# Patient Record
Sex: Female | Born: 1975 | Race: White | Hispanic: No | Marital: Married | Smoking: Never smoker
Health system: Southern US, Community
[De-identification: ages and names within clinical notes are randomized; demographics above are authoritative.]

## PROBLEM LIST (undated history)

## (undated) DIAGNOSIS — F329 Major depressive disorder, single episode, unspecified: Secondary | ICD-10-CM

## (undated) DIAGNOSIS — T7840XA Allergy, unspecified, initial encounter: Secondary | ICD-10-CM

## (undated) DIAGNOSIS — N390 Urinary tract infection, site not specified: Secondary | ICD-10-CM

## (undated) DIAGNOSIS — F419 Anxiety disorder, unspecified: Secondary | ICD-10-CM

## (undated) DIAGNOSIS — O020 Blighted ovum and nonhydatidiform mole: Secondary | ICD-10-CM

## (undated) DIAGNOSIS — F32A Depression, unspecified: Secondary | ICD-10-CM

## (undated) DIAGNOSIS — N3090 Cystitis, unspecified without hematuria: Secondary | ICD-10-CM

## (undated) HISTORY — DX: Allergy, unspecified, initial encounter: T78.40XA

## (undated) HISTORY — DX: Depression, unspecified: F32.A

## (undated) HISTORY — DX: Anxiety disorder, unspecified: F41.9

## (undated) HISTORY — DX: Blighted ovum and nonhydatidiform mole: O02.0

## (undated) HISTORY — DX: Cystitis, unspecified without hematuria: N30.90

## (undated) HISTORY — DX: Major depressive disorder, single episode, unspecified: F32.9

## (undated) HISTORY — PX: DILATION AND CURETTAGE OF UTERUS: SHX78

## (undated) HISTORY — DX: Urinary tract infection, site not specified: N39.0

---

## 2010-07-05 HISTORY — PX: INCISE AND DRAIN ABCESS: PRO64

## 2010-12-23 ENCOUNTER — Inpatient Hospital Stay: Payer: Self-pay | Admitting: Obstetrics and Gynecology

## 2015-07-29 ENCOUNTER — Other Ambulatory Visit: Payer: Self-pay | Admitting: Obstetrics and Gynecology

## 2015-07-29 DIAGNOSIS — Z1231 Encounter for screening mammogram for malignant neoplasm of breast: Secondary | ICD-10-CM

## 2015-07-31 ENCOUNTER — Ambulatory Visit
Admission: RE | Admit: 2015-07-31 | Discharge: 2015-07-31 | Disposition: A | Discharge status: Home / Self Care | Payer: BLUE CROSS/BLUE SHIELD | Source: Ambulatory Visit | Attending: Obstetrics and Gynecology | Admitting: Obstetrics and Gynecology

## 2015-07-31 DIAGNOSIS — Z1231 Encounter for screening mammogram for malignant neoplasm of breast: Secondary | ICD-10-CM | POA: Insufficient documentation

## 2015-08-05 ENCOUNTER — Other Ambulatory Visit: Payer: Self-pay | Admitting: Obstetrics and Gynecology

## 2015-08-05 ENCOUNTER — Encounter: Payer: Self-pay | Admitting: *Deleted

## 2015-08-05 DIAGNOSIS — R928 Other abnormal and inconclusive findings on diagnostic imaging of breast: Secondary | ICD-10-CM

## 2015-08-05 DIAGNOSIS — N390 Urinary tract infection, site not specified: Secondary | ICD-10-CM | POA: Insufficient documentation

## 2015-08-06 ENCOUNTER — Ambulatory Visit (INDEPENDENT_AMBULATORY_CARE_PROVIDER_SITE_OTHER): Payer: BLUE CROSS/BLUE SHIELD | Admitting: Obstetrics and Gynecology

## 2015-08-06 ENCOUNTER — Encounter: Payer: Self-pay | Admitting: Obstetrics and Gynecology

## 2015-08-06 VITALS — BP 147/81 | HR 93 | Resp 16 | Ht 62.0 in | Wt 130.9 lb

## 2015-08-06 DIAGNOSIS — N3090 Cystitis, unspecified without hematuria: Secondary | ICD-10-CM

## 2015-08-06 LAB — URINALYSIS, COMPLETE
BILIRUBIN UA: NEGATIVE
Glucose, UA: NEGATIVE
Ketones, UA: NEGATIVE
Leukocytes, UA: NEGATIVE
NITRITE UA: NEGATIVE
PH UA: 5.5 (ref 5.0–7.5)
Protein, UA: NEGATIVE
RBC UA: NEGATIVE
Specific Gravity, UA: 1.025 (ref 1.005–1.030)
UUROB: 0.2 mg/dL (ref 0.2–1.0)

## 2015-08-06 LAB — BLADDER SCAN AMB NON-IMAGING

## 2015-08-06 LAB — MICROSCOPIC EXAMINATION: WBC UA: NONE SEEN /HPF (ref 0–?)

## 2015-08-06 NOTE — Progress Notes (Signed)
08/06/2015 2:24 PM   Alexandria Garrett Oct 05, 1975 [id number]  Referring provider: No referring provider defined for this encounter.  Chief Complaint  Patient presents with  . Cystitis  . Establish Care    HPI: Patient is a 40 year old female presenting today with complaints of irritative voiding symptoms. She reports symptoms have been occurring since last year when she experienced 2 urinary tract infections. 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 She reports that she continues to experience some irritative voiding symptoms such as frequent urination, nocturia and a suprapubic burning sensation.  She did some research on her own and states that she suspects that she has interstitial cystitis. She has altered her diet and has been avoiding bladder irritants. She has noticed significant improvement reports almost 90% resolution of her symptoms since changing her diet. On occasion she will take Motrin at night when she has flareups.  She denies symptoms of dysuria, flank pain, gross hematuria, urgency or incontinence.  She was never a smoker. No hisotry of chemical exposures.  Good fluid intake. Coconut water and aloe juice improves symptoms.   07/29/15 UA unremarkable/ Urine culture- no growth   PMH: Past Medical History  Diagnosis Date  . Cystitis   . UTI (lower urinary tract infection)   . Molar pregnancy     Surgical History: Past Surgical History  Procedure Laterality Date  . Incise and drain abcess Right 2012    breast abcess  . Dilation and curettage of uterus      Home Medications:    Medication List    Notice  As of 08/06/2015  2:24 PM   You have not been prescribed any medications.      Allergies:  Allergies  Allergen Reactions  . Penicillins Other (See Comments)  . Sulfa Antibiotics Rash    Family History: Family History  Problem Relation Age of Onset  . Breast cancer Neg Hx   . Alzheimer's  disease Maternal Grandmother   . Endometriosis Mother   . Hypertension Mother   . Heart failure Maternal Grandfather     Social History:  reports that she has never smoked. She does not have any smokeless tobacco history on file. She reports that she drinks alcohol. She reports that she does not use illicit drugs.  ROS: UROLOGY Frequent Urination?: Yes Hard to postpone urination?: No Burning/pain with urination?: No Get up at night to urinate?: Yes Leakage of urine?: No Urine stream starts and stops?: No Trouble starting stream?: No Do you have to strain to urinate?: No Blood in urine?: No Urinary tract infection?: No Sexually transmitted disease?: No Injury to kidneys or bladder?: No Painful intercourse?: Yes Weak stream?: No Currently pregnant?: No Vaginal bleeding?: No Last menstrual period?: 07-20-15  Gastrointestinal Nausea?: No Vomiting?: No Indigestion/heartburn?: No Diarrhea?: No Constipation?: No  Constitutional Fever: No Night sweats?: No Weight loss?: No Fatigue?: No  Skin Skin rash/lesions?: No Itching?: No  Eyes Blurred vision?: No Double vision?: No  Ears/Nose/Throat Sore throat?: No Sinus problems?: Yes  Hematologic/Lymphatic Swollen glands?: No Easy bruising?: No  Cardiovascular Leg swelling?: No Chest pain?: No  Respiratory Cough?: No Shortness of breath?: No  Endocrine Excessive thirst?: No  Musculoskeletal Back pain?: Yes Joint pain?: No  Neurological Headaches?: Yes Dizziness?: No  Psychologic Depression?: No Anxiety?: Yes  Physical Exam: BP 147/81 mmHg  Pulse 93  Resp 16  Ht  (1.575 m)  Wt 130 lb 14.4 oz (59.376 kg)  BMI 23.94 kg/m2  LMP 07/20/2015  Constitutional:  Alert and oriented, No acute distress. HEENT: [place] AT, moist mucus membranes.  Trachea midline, no masses. Cardiovascular: No clubbing, cyanosis, or edema. Respiratory: Normal respiratory effort, no increased work of breathing. GI: Abdomen  is soft, nontender, nondistended, no abdominal masses GU: No CVA tenderness.  Skin: No rashes, bruises or suspicious lesions. Lymph: No cervical or inguinal adenopathy. Neurologic: Grossly intact, no focal deficits, moving all 4 extremities. Psychiatric: Normal mood and affect.  Laboratory Data:  No results found for: WBC, HGB, HCT, MCV, PLT  No results found for: CREATININE  No results found for: PSA  No results found for: TESTOSTERONE  No results found for: HGBA1C  Urinalysis UA unremarkble  Pertinent Imaging:  Assessment & Plan:    1. Cystitis- UA unremarkable. PVR minimal. Patient reports significant improvement with alterations in her diet. We discussed the diease pathophysiology is poorly understood therefore treatment has been focused primarily on symptomatic relief as well as dietary and behavioral modification. Information pamphlets were reviewed and given today discussing the current understanding of the syndrome as well as treatment options. IC dietary information also given today as many patients experience relief with simple lifestyle modifications. We also discussed intermittent use of over the counter pyridium (no greater than 3 days at at time) for intermittent relief.  If conservative management fails, will consider further work up with cystoscopy to access for Hunter's ulcers or other more aggressive treatments, however, response to each of these interventions is highly variable.   - Urinalysis, Complete - BLADDER SCAN AMB NON-IMAGING   Return if symptoms worsen or fail to improve.  These notes generated with voice recognition software. I apologize for typographical errors.  Earlie Lou, FNP  [facility] [address] [place], [place] [address] [phone]

## 2015-08-06 NOTE — Patient Instructions (Signed)
Interstitial Cystitis Interstitial cystitis is a condition that causes inflammation of the bladder. The bladder is a hollow organ in the lower part of your abdomen. It stores urine after the urine is made by your kidneys. With interstitial cystitis, you may have pain in the bladder area. You may also have a frequent and urgent need to urinate. The severity of interstitial cystitis can vary from person to person. You may have flare-ups of the condition, and then it may go away for a while. For many people who have this condition, it becomes a long-term problem. CAUSES The cause of this condition is not known. RISK FACTORS This condition is more likely to develop in women. SYMPTOMS Symptoms of interstitial cystitis vary, and they can change over time. Symptoms may include:  Discomfort or pain in the bladder area. This can range from mild to severe. The pain may change in intensity as the bladder fills with urine or as it empties.  Pelvic pain.  An urgent need to urinate.  Frequent urination.  Pain during sexual intercourse.  Pinpoint bleeding on the bladder wall. For women, the symptoms often get worse during menstruation. DIAGNOSIS This condition is diagnosed by evaluating your symptoms and ruling out other causes. A physical exam will be done. Various tests may be done to rule out other conditions. Common tests include:  Urine tests.  Cystoscopy. In this test, a tool that is like a very thin telescope is used to look into your bladder.  Biopsy. This involves taking a sample of tissue from the bladder wall to be examined under a microscope. TREATMENT There is no cure for interstitial cystitis, but treatment methods are available to control your symptoms. Work closely with your health care provider to find the treatments that will be most effective for you. Treatment options may include:  Medicines to relieve pain and to help reduce the number of times that you feel the need to  urinate.  Bladder training. This involves learning ways to control when you urinate, such as:  Urinating at scheduled times.  Training yourself to delay urination.  Doing exercises (Kegel exercises) to strengthen the muscles that control urine flow.  Lifestyle changes, such as changing your diet or taking steps to control stress.  Use of a device that provides electrical stimulation in order to reduce pain.  A procedure that stretches your bladder by filling it with air or fluid.  Surgery. This is rare. It is only done for extreme cases if other treatments do not help. HOME CARE INSTRUCTIONS  Take medicines only as directed by your health care provider.  Use bladder training techniques as directed.  Keep a bladder diary to find out which foods, liquids, or activities make your symptoms worse.  Use your bladder diary to schedule bathroom trips. If you are away from home, plan to be near a bathroom at each of your scheduled times.  Make sure you urinate just before you leave the house and just before you go to bed.  Do Kegel exercises as directed by your health care provider.  Do not drink alcohol.  Do not use any tobacco products, including cigarettes, chewing tobacco, or electronic cigarettes. If you need help quitting, ask your health care provider.  Make dietary changes as directed by your health care provider. You may need to avoid spicy foods and foods that contain a high amount of potassium.  Limit your drinking of beverages that stimulate urination. These include soda, coffee, and tea.  Keep all follow-up   visits as directed by your health care provider. This is important. SEEK MEDICAL CARE IF:  Your symptoms do not get better after treatment.  Your pain and discomfort are getting worse.  You have more frequent urges to urinate.  You have a fever. SEEK IMMEDIATE MEDICAL CARE IF:  You are not able to control your bladder at all.   This information is not  intended to replace advice given to you by your health care provider. Make sure you discuss any questions you have with your health care provider.   Document Released: 02/20/2004 Document Revised: 07/12/2014 Document Reviewed: 02/26/2014 Elsevier Interactive Patient Education 2016 [employer]  

## 2015-08-22 ENCOUNTER — Ambulatory Visit
Admission: RE | Admit: 2015-08-22 | Discharge: 2015-08-22 | Disposition: A | Discharge status: Home / Self Care | Payer: BLUE CROSS/BLUE SHIELD | Source: Ambulatory Visit | Attending: Obstetrics and Gynecology | Admitting: Obstetrics and Gynecology

## 2015-08-22 DIAGNOSIS — N63 Unspecified lump in breast: Secondary | ICD-10-CM | POA: Diagnosis not present

## 2015-08-22 DIAGNOSIS — R928 Other abnormal and inconclusive findings on diagnostic imaging of breast: Secondary | ICD-10-CM

## 2015-08-25 ENCOUNTER — Inpatient Hospital Stay: Payer: BLUE CROSS/BLUE SHIELD

## 2015-08-25 ENCOUNTER — Encounter: Payer: Self-pay | Admitting: Internal Medicine

## 2015-08-25 ENCOUNTER — Inpatient Hospital Stay: Payer: BLUE CROSS/BLUE SHIELD | Attending: Internal Medicine | Admitting: Internal Medicine

## 2015-08-25 ENCOUNTER — Telehealth: Payer: Self-pay | Admitting: *Deleted

## 2015-08-25 VITALS — BP 134/88 | HR 105 | Temp 98.5°F | Resp 18 | Ht 62.0 in | Wt 132.5 lb

## 2015-08-25 DIAGNOSIS — D72819 Decreased white blood cell count, unspecified: Secondary | ICD-10-CM | POA: Insufficient documentation

## 2015-08-25 DIAGNOSIS — Z87440 Personal history of urinary (tract) infections: Secondary | ICD-10-CM | POA: Insufficient documentation

## 2015-08-25 DIAGNOSIS — D708 Other neutropenia: Secondary | ICD-10-CM

## 2015-08-25 DIAGNOSIS — D709 Neutropenia, unspecified: Secondary | ICD-10-CM | POA: Diagnosis present

## 2015-08-25 LAB — CBC WITH DIFFERENTIAL/PLATELET
Basophils Absolute: 0.1 10*3/uL (ref 0–0.1)
Basophils Relative: 3 %
EOS ABS: 0 10*3/uL (ref 0–0.7)
EOS PCT: 1 %
HCT: 39.9 % (ref 35.0–47.0)
Hemoglobin: 13.5 g/dL (ref 12.0–16.0)
LYMPHS ABS: 1 10*3/uL (ref 1.0–3.6)
Lymphocytes Relative: 39 %
MCH: 30.2 pg (ref 26.0–34.0)
MCHC: 33.9 g/dL (ref 32.0–36.0)
MCV: 89.2 fL (ref 80.0–100.0)
MONO ABS: 0.2 10*3/uL (ref 0.2–0.9)
MONOS PCT: 9 %
Neutro Abs: 1.3 10*3/uL — ABNORMAL LOW (ref 1.4–6.5)
Neutrophils Relative %: 48 %
PLATELETS: 327 10*3/uL (ref 150–440)
RBC: 4.47 MIL/uL (ref 3.80–5.20)
RDW: 13.1 % (ref 11.5–14.5)
WBC: 2.7 10*3/uL — AB (ref 3.6–11.0)

## 2015-08-25 LAB — LACTATE DEHYDROGENASE: LDH: 95 U/L — AB (ref 98–192)

## 2015-08-25 NOTE — Telephone Encounter (Signed)
Pt contacted. Patient provided with 2 additional lab appointments. (March 17 and April 20). She will be out of town the week of March 20'th; however will be reachable via cell phone for results. Labs entered per md order.

## 2015-08-25 NOTE — Progress Notes (Signed)
[facility] NOTE  Patient Care Team: Darliss Cheney, CNM as PCP - General (Certified Nurse Midwife)  CHIEF COMPLAINTS/PURPOSE OF CONSULTATION: Neutropenia  HISTORY OF PRESENTING ILLNESS:  Alexandria Garrett 40 y.o.  female  With no significant past medical history-  Noted to have  Recent UTI for which she was started on  Amoxicillin.  A CBC at that time showed white count of 2 .9/ neutrophil count of 1000.   2 weeks later repeat CBC showed white count of 2.7/ ANC of 1000.  She has been referred to [place] for further evaluation.  Patient states that   She does not have any frequent infections or sinusitis or  Admission the hospital.  She denies any while fevers or infections.  Nobody sick at home. Patient denies any alcohol abuse;  She also denies any  Skin rash or arthritic symptoms suggestive of any  Autoimmune process.  She denies  Learning about  Any abnormal blood counts previously. No weight loss or night sweats.   ROS: A complete 10 point review of system is done which is negative except mentioned above in history of present illness  MEDICAL HISTORY:  Past Medical History  Diagnosis Date  . Cystitis   . UTI (lower urinary tract infection)   . Molar pregnancy     SURGICAL HISTORY: Past Surgical History  Procedure Laterality Date  . Incise and drain abcess Right 2012    breast abcess  . Dilation and curettage of uterus      SOCIAL HISTORY: Social History   Social History  . Marital Status: Married    Spouse Name: N/A  . Number of Children: N/A  . Years of Education: N/A   Occupational History  . Not on file.   Social History Main Topics  . Smoking status: Never Smoker   . Smokeless tobacco: Not on file  . Alcohol Use: 0.0 oz/week    0 Standard drinks or equivalent per week  . Drug Use: No  . Sexual Activity: Not on file   Other Topics Concern  . Not on file   Social History Narrative    FAMILY HISTORY: Family History  Problem Relation  Age of Onset  . Breast cancer Neg Hx   . Alzheimer's disease Maternal Grandmother   . Endometriosis Mother   . Hypertension Mother   . Heart failure Maternal Grandfather     ALLERGIES:  is allergic to penicillins and sulfa antibiotics.  MEDICATIONS:  Current Outpatient Prescriptions  Medication Sig Dispense Refill  . diphenhydrAMINE (BENADRYL CHILDRENS ALLERGY) 12.5 MG/5ML liquid Take 12.5 mg by mouth daily as needed.    Marland Kitchen ibuprofen (ADVIL,MOTRIN) 200 MG tablet Take 200 mg by mouth every 6 (six) hours as needed.     No current facility-administered medications for this visit.      Marland Kitchen  PHYSICAL EXAMINATION:   Filed Vitals:   08/25/15 1005  BP: 134/88  Pulse: 105  Temp: 98.5 F (36.9 C)  Resp: 18   Filed Weights   08/25/15 1005  Weight: 132 lb 7.9 oz (60.1 kg)    GENERAL: Well-nourished well-developed; Alert, no distress and comfortable.  Accompanied by family.  EYES: no pallor or icterus OROPHARYNX: no thrush or ulceration; good dentition  NECK: supple, no masses felt LYMPH:  no palpable lymphadenopathy in the cervical, axillary or inguinal regions LUNGS: clear to auscultation and  No wheeze or crackles HEART/CVS: regular rate & rhythm and no murmurs; No lower extremity edema ABDOMEN: abdomen soft,  non-tender and normal bowel sounds Musculoskeletal:no cyanosis of digits and no clubbing  PSYCH: alert & oriented x 3 with fluent speech NEURO: no focal motor/sensory deficits SKIN:  no rashes or significant lesions  LABORATORY DATA:   RADIOGRAPHIC STUDIES: I have personally reviewed the radiological images as listed and agreed with the findings in the report. Mm Digital Screening Bilateral  08/01/2015  CLINICAL DATA:  Screening. EXAM: DIGITAL SCREENING BILATERAL MAMMOGRAM WITH CAD COMPARISON:  None ACR Breast Density Category d: The breast tissue is extremely dense, which lowers the sensitivity of mammography. FINDINGS: In the right breast, a possible mass warrants  further evaluation. In the left breast, no findings suspicious for malignancy. Images were processed with CAD. IMPRESSION: Further evaluation is suggested for possible mass in the right breast. RECOMMENDATION: Diagnostic mammogram and possibly ultrasound of the right breast. (Code:[id number]-R-73M) The patient will be contacted regarding the findings, and additional imaging will be scheduled. BI-RADS CATEGORY  0: Incomplete. Need additional imaging evaluation and/or prior mammograms for comparison. Electronically Signed   By: Skipper Cliche M.D.   On: 08/01/2015 16:30   US Breast Ltd Uni Right Inc Axilla  08/22/2015  CLINICAL DATA:  Patient returns today to evaluate a possible mass within the upper-outer quadrant of the right breast. Additional asymmetry within the far superior right breast/axilla was also questioned on the recent screening mammogram. EXAM: DIGITAL DIAGNOSTIC RIGHT MAMMOGRAM WITH 3D TOMOSYNTHESIS WITH CAD ULTRASOUND RIGHT BREAST COMPARISON:  Previous exams including recent screening mammogram dated 07/31/2015. ACR Breast Density Category c: The breast tissue is heterogeneously dense, which may obscure small masses. FINDINGS: On today's additional views of the right breast with 3D tomosynthesis, there is a persistent oval circumscribed mass within the upper-outer quadrant of the right breast, at middle to posterior depth, measuring 8 mm greatest dimension. The additional questioned asymmetry within the far superior right breast/axilla is resolved on today's additional images. Mammographic images were processed with CAD. On physical exam, no palpable abnormality within the upper-outer quadrant. Targeted ultrasound is performed, evaluating the upper-outer quadrant of the right breast, showing an oval circumscribed hypoechoic mass at the 10 o'clock axis, 6 cm from the nipple, measuring 9 x 3 x 5 mm, corresponding with the mammographic finding, most suggestive of benign intramammary lymph node during  real-time ultrasound evaluation, less likely benign cluster of complicated cysts with internal debris. Multiple additional scattered cysts, some simple and some complicated, are also appreciated within the outer right breast. There are no suspicious solid or cystic masses identified by ultrasound. IMPRESSION: No evidence of malignancy within the right breast. Benign intramammary lymph node versus cluster of cysts within the upper-outer quadrant of the right breast, corresponding to the screening mammogram finding. Patient may return to routine annual bilateral screening mammogram schedule. RECOMMENDATION: Screening mammogram in one year.(Code:SM-B-01Y) I have discussed the findings and recommendations with the patient. Results were also provided in writing at the conclusion of the visit. If applicable, a reminder letter will be sent to the patient regarding the next appointment. BI-RADS CATEGORY  2: Benign. Electronically Signed   By: Franki Cabot M.D.   On: 08/22/2015 10:15   Mm Diag Breast Tomo Uni Right  08/22/2015  CLINICAL DATA:  Patient returns today to evaluate a possible mass within the upper-outer quadrant of the right breast. Additional asymmetry within the far superior right breast/axilla was also questioned on the recent screening mammogram. EXAM: DIGITAL DIAGNOSTIC RIGHT MAMMOGRAM WITH 3D TOMOSYNTHESIS WITH CAD ULTRASOUND RIGHT BREAST COMPARISON:  Previous exams including recent  screening mammogram dated 07/31/2015. ACR Breast Density Category c: The breast tissue is heterogeneously dense, which may obscure small masses. FINDINGS: On today's additional views of the right breast with 3D tomosynthesis, there is a persistent oval circumscribed mass within the upper-outer quadrant of the right breast, at middle to posterior depth, measuring 8 mm greatest dimension. The additional questioned asymmetry within the far superior right breast/axilla is resolved on today's additional images. Mammographic  images were processed with CAD. On physical exam, no palpable abnormality within the upper-outer quadrant. Targeted ultrasound is performed, evaluating the upper-outer quadrant of the right breast, showing an oval circumscribed hypoechoic mass at the 10 o'clock axis, 6 cm from the nipple, measuring 9 x 3 x 5 mm, corresponding with the mammographic finding, most suggestive of benign intramammary lymph node during real-time ultrasound evaluation, less likely benign cluster of complicated cysts with internal debris. Multiple additional scattered cysts, some simple and some complicated, are also appreciated within the outer right breast. There are no suspicious solid or cystic masses identified by ultrasound. IMPRESSION: No evidence of malignancy within the right breast. Benign intramammary lymph node versus cluster of cysts within the upper-outer quadrant of the right breast, corresponding to the screening mammogram finding. Patient may return to routine annual bilateral screening mammogram schedule. RECOMMENDATION: Screening mammogram in one year.(Code:SM-B-01Y) I have discussed the findings and recommendations with the patient. Results were also provided in writing at the conclusion of the visit. If applicable, a reminder letter will be sent to the patient regarding the next appointment. BI-RADS CATEGORY  2: Benign. Electronically Signed   By: Franki Cabot M.D.   On: 08/22/2015 10:15    ASSESSMENT & PLAN:   #   Incidental leukopenia/ neutropenia-  ANC 1000.  The etiology is unclear-  However given the asymptomatic clinical course;  I suspect  A benign process like  Chronic mild neutropenia versus  Drug exposure;  Less likely any significant autoimmune process/ or malignancy.   I would recommend checking CBC LDH today. Check peripheral smear.  And based upon the labs she might need follow-up CBC every month or so.  If  Neutropenia is getting worse/  I might recommend a bone marrow biopsy for further   Evaluation;  Also a CAT scan as needed.  #  The above plan of care was discussed with the patient and family in detail.  She'll follow-up with me in approximately 3 months with the CBC.  The interim follow-up will be based upon the last today.  Thank you  for allowing me to participate in the care of your pleasant patient. Please do not hesitate to contact me with questions or concerns in the interim.     Cammie Sickle, MD 08/25/2015 10:25 AM

## 2015-08-25 NOTE — Telephone Encounter (Signed)
-----   Message from Earna Coder, MD sent at 08/25/2015  1:14 PM EST ----- Please inform patient that  CBC-  Shows low white count/  Low neutrophil count-  But no different from last blood work.  Recommend CBC on a monthly basis/ please order.  Follow up as planned  In 3 months/labs.

## 2015-09-19 ENCOUNTER — Inpatient Hospital Stay: Payer: BLUE CROSS/BLUE SHIELD | Attending: Internal Medicine

## 2015-09-19 DIAGNOSIS — D709 Neutropenia, unspecified: Secondary | ICD-10-CM | POA: Insufficient documentation

## 2015-09-19 DIAGNOSIS — D708 Other neutropenia: Secondary | ICD-10-CM

## 2015-09-19 LAB — CBC WITH DIFFERENTIAL/PLATELET
BASOS ABS: 0.1 10*3/uL (ref 0–0.1)
Basophils Relative: 2 %
Eosinophils Absolute: 0.2 10*3/uL (ref 0–0.7)
Eosinophils Relative: 5 %
HEMATOCRIT: 40.3 % (ref 35.0–47.0)
HEMOGLOBIN: 13.8 g/dL (ref 12.0–16.0)
LYMPHS PCT: 48 %
Lymphs Abs: 1.7 10*3/uL (ref 1.0–3.6)
MCH: 30.5 pg (ref 26.0–34.0)
MCHC: 34.4 g/dL (ref 32.0–36.0)
MCV: 88.9 fL (ref 80.0–100.0)
MONO ABS: 0.3 10*3/uL (ref 0.2–0.9)
MONOS PCT: 8 %
NEUTROS ABS: 1.3 10*3/uL — AB (ref 1.4–6.5)
NEUTROS PCT: 37 %
Platelets: 315 10*3/uL (ref 150–440)
RBC: 4.54 MIL/uL (ref 3.80–5.20)
RDW: 13.4 % (ref 11.5–14.5)
WBC: 3.4 10*3/uL — ABNORMAL LOW (ref 3.6–11.0)

## 2015-09-22 ENCOUNTER — Telehealth: Payer: Self-pay | Admitting: *Deleted

## 2015-09-22 NOTE — Telephone Encounter (Signed)
-----   Message from Earna CoderGovinda R Brahmanday, MD sent at 09/22/2015  7:52 AM EDT ----- Please inform pt that labs are slightly improved from last month; continue to check cbc in 1 months; and follow up with me as planned. Thx

## 2015-09-22 NOTE — Telephone Encounter (Signed)
Called and left vm msg for patient. Patient is already scheduled in 1 month for repeat labs on 10/23/15.

## 2015-10-23 ENCOUNTER — Inpatient Hospital Stay: Payer: BLUE CROSS/BLUE SHIELD | Attending: Internal Medicine

## 2015-10-23 DIAGNOSIS — D709 Neutropenia, unspecified: Secondary | ICD-10-CM | POA: Insufficient documentation

## 2015-10-23 DIAGNOSIS — D708 Other neutropenia: Secondary | ICD-10-CM

## 2015-10-23 LAB — CBC WITH DIFFERENTIAL/PLATELET
BASOS PCT: 1 %
Basophils Absolute: 0 10*3/uL (ref 0–0.1)
Eosinophils Absolute: 0 10*3/uL (ref 0–0.7)
Eosinophils Relative: 2 %
HEMATOCRIT: 42.4 % (ref 35.0–47.0)
HEMOGLOBIN: 14.4 g/dL (ref 12.0–16.0)
LYMPHS ABS: 1.3 10*3/uL (ref 1.0–3.6)
LYMPHS PCT: 48 %
MCH: 30.2 pg (ref 26.0–34.0)
MCHC: 33.9 g/dL (ref 32.0–36.0)
MCV: 89.3 fL (ref 80.0–100.0)
MONOS PCT: 12 %
Monocytes Absolute: 0.3 10*3/uL (ref 0.2–0.9)
NEUTROS ABS: 1 10*3/uL — AB (ref 1.4–6.5)
NEUTROS PCT: 37 %
Platelets: 297 10*3/uL (ref 150–440)
RBC: 4.75 MIL/uL (ref 3.80–5.20)
RDW: 13.6 % (ref 11.5–14.5)
WBC: 2.7 10*3/uL — ABNORMAL LOW (ref 3.6–11.0)

## 2015-10-24 ENCOUNTER — Telehealth: Payer: Self-pay | Admitting: *Deleted

## 2015-10-24 NOTE — Telephone Encounter (Signed)
Pt notified to call our office back if she has any further episodes of fevers. Teach back process performed.

## 2015-10-24 NOTE — Telephone Encounter (Signed)
Patient is to call us for antibiotics if she has fevers again. Thx

## 2015-10-24 NOTE — Telephone Encounter (Signed)
-----   Message from Earna CoderGovinda R Brahmanday, MD sent at 10/24/2015  3:50 PM EDT ----- Please inform patient that white count as stable/ neutrophil count steady at 1.0. We will continue to monitor with a repeat CBC at next visit. If patient having infectious issues- please inform me. Thx

## 2015-10-24 NOTE — Telephone Encounter (Signed)
I contacted the patient with her results. She has had 'flu like symptoms: cough/croupy, temp 102-several days ago. Pt temp this morning 99. She seems like her symptoms are improving and she is feeling much. Her husband is now sick with the same symptoms.  She did not go to the pmd to be tx with any antibiotics.

## 2015-11-24 ENCOUNTER — Inpatient Hospital Stay: Payer: BLUE CROSS/BLUE SHIELD

## 2015-11-24 ENCOUNTER — Inpatient Hospital Stay: Payer: BLUE CROSS/BLUE SHIELD | Attending: Internal Medicine | Admitting: Internal Medicine

## 2015-11-24 VITALS — BP 124/85 | HR 83 | Temp 96.7°F | Resp 20 | Wt 127.4 lb

## 2015-11-24 DIAGNOSIS — Z87440 Personal history of urinary (tract) infections: Secondary | ICD-10-CM | POA: Insufficient documentation

## 2015-11-24 DIAGNOSIS — D708 Other neutropenia: Secondary | ICD-10-CM

## 2015-11-24 DIAGNOSIS — D72819 Decreased white blood cell count, unspecified: Secondary | ICD-10-CM | POA: Insufficient documentation

## 2015-11-24 DIAGNOSIS — D709 Neutropenia, unspecified: Secondary | ICD-10-CM | POA: Insufficient documentation

## 2015-11-24 LAB — CBC WITH DIFFERENTIAL/PLATELET
BASOS ABS: 0.1 10*3/uL (ref 0–0.1)
Basophils Relative: 1 %
EOS ABS: 0.1 10*3/uL (ref 0–0.7)
EOS PCT: 2 %
HCT: 39.6 % (ref 35.0–47.0)
Hemoglobin: 13.7 g/dL (ref 12.0–16.0)
LYMPHS ABS: 1.7 10*3/uL (ref 1.0–3.6)
Lymphocytes Relative: 42 %
MCH: 30.6 pg (ref 26.0–34.0)
MCHC: 34.5 g/dL (ref 32.0–36.0)
MCV: 88.8 fL (ref 80.0–100.0)
MONO ABS: 0.3 10*3/uL (ref 0.2–0.9)
Monocytes Relative: 9 %
Neutro Abs: 1.9 10*3/uL (ref 1.4–6.5)
Neutrophils Relative %: 46 %
PLATELETS: 292 10*3/uL (ref 150–440)
RBC: 4.47 MIL/uL (ref 3.80–5.20)
RDW: 13.8 % (ref 11.5–14.5)
WBC: 4 10*3/uL (ref 3.6–11.0)

## 2015-11-24 NOTE — Progress Notes (Signed)
[facility] NOTE  Patient Care Team: Darliss Cheney, CNM as PCP - General (Certified Nurse Midwife)  CHIEF COMPLAINTS/PURPOSE OF CONSULTATION: Neutropenia  # FEB 2017-  MILD NEUTROPENIA- [place] 1000-1500- ? etioogy- surveillance  HISTORY OF PRESENTING ILLNESS:  Alexandria Garrett 40 y.o.  female  with mild neutropenia/found incidentally on labs is here for follow-up.    In the interim patient had a viral infection that resolved without antibiotics. Denies any repeated infections or pneumonias or hospitalizations.  ROS: A complete 10 point review of system is done which is negative except mentioned above in history of present illness  MEDICAL HISTORY:  Past Medical History  Diagnosis Date  . Cystitis   . UTI (lower urinary tract infection)   . Molar pregnancy     SURGICAL HISTORY: Past Surgical History  Procedure Laterality Date  . Incise and drain abcess Right 2012    breast abcess  . Dilation and curettage of uterus      SOCIAL HISTORY: Social History   Social History  . Marital Status: Married    Spouse Name: N/A  . Number of Children: N/A  . Years of Education: N/A   Occupational History  . Not on file.   Social History Main Topics  . Smoking status: Never Smoker   . Smokeless tobacco: Not on file  . Alcohol Use: 0.0 oz/week    0 Standard drinks or equivalent per week  . Drug Use: No  . Sexual Activity: Not on file   Other Topics Concern  . Not on file   Social History Narrative    FAMILY HISTORY: Family History  Problem Relation Age of Onset  . Breast cancer Neg Hx   . Alzheimer's disease Maternal Grandmother   . Endometriosis Mother   . Hypertension Mother   . Heart failure Maternal Grandfather     ALLERGIES:  is allergic to penicillins and sulfa antibiotics.  MEDICATIONS:  Current Outpatient Prescriptions  Medication Sig Dispense Refill  . diphenhydrAMINE (BENADRYL CHILDRENS ALLERGY) 12.5 MG/5ML liquid Take 12.5 mg by  mouth daily as needed.    Marland Kitchen ibuprofen (ADVIL,MOTRIN) 200 MG tablet Take 200 mg by mouth every 6 (six) hours as needed.     No current facility-administered medications for this visit.      Marland Kitchen  PHYSICAL EXAMINATION:   Filed Vitals:   11/24/15 1012  BP: 124/85  Pulse: 83  Temp: 96.7 F (35.9 C)   Filed Weights   11/24/15 1012  Weight: 127 lb 6.8 oz (57.8 kg)    GENERAL: Well-nourished well-developed; Alert, no distress and comfortable.  Accompanied by family.  EYES: no pallor or icterus OROPHARYNX: no thrush or ulceration; good dentition  NECK: supple, no masses felt LYMPH:  no palpable lymphadenopathy in the cervical, axillary or inguinal regions LUNGS: clear to auscultation and  No wheeze or crackles HEART/CVS: regular rate & rhythm and no murmurs; No lower extremity edema ABDOMEN: abdomen soft, non-tender and normal bowel sounds Musculoskeletal:no cyanosis of digits and no clubbing  PSYCH: alert & oriented x 3 with fluent speech NEURO: no focal motor/sensory deficits SKIN:  no rashes or significant lesions  LABORATORY DATA:   RADIOGRAPHIC STUDIES: I have personally reviewed the radiological images as listed and agreed with the findings in the report. No results found.  ASSESSMENT & PLAN:   #   Incidental leukopenia/ neutropenia-  ANC 1000. Over the last 3 months- white count had been low/with ANC- 1000 to 1500. She continues to be  asymptomatic. Today her white count is 4.0; absolute neutrophil can of 2000- normal hemoglobin and platelets.  # For now I do not recommend a bone marrow biopsy or any further imaging as the counts are improved and patient continues to be asymptomatic.   # Recommend checking a CBC in 3 months follow-up with me in 6 months with labs.     Cammie Sickle, MD 11/24/2015 11:03 AM

## 2016-02-24 ENCOUNTER — Inpatient Hospital Stay: Payer: BLUE CROSS/BLUE SHIELD

## 2016-05-19 ENCOUNTER — Ambulatory Visit: Payer: BLUE CROSS/BLUE SHIELD | Admitting: Internal Medicine

## 2016-05-19 ENCOUNTER — Other Ambulatory Visit: Payer: BLUE CROSS/BLUE SHIELD

## 2016-09-03 ENCOUNTER — Other Ambulatory Visit: Payer: Self-pay | Admitting: Medical

## 2016-09-03 ENCOUNTER — Other Ambulatory Visit: Payer: Self-pay

## 2016-09-03 DIAGNOSIS — Z0000 Encounter for general adult medical examination without abnormal findings: Secondary | ICD-10-CM

## 2016-09-04 LAB — CMP12+LP+TP+TSH+6AC+CBC/D/PLT
ALK PHOS: 39 IU/L (ref 39–117)
ALT: 11 IU/L (ref 0–32)
AST: 15 IU/L (ref 0–40)
Albumin/Globulin Ratio: 1.9 (ref 1.2–2.2)
Albumin: 4.7 g/dL (ref 3.5–5.5)
BASOS: 2 %
BUN / CREAT RATIO: 12 (ref 9–23)
BUN: 8 mg/dL (ref 6–20)
Basophils Absolute: 0 10*3/uL (ref 0.0–0.2)
Bilirubin Total: 1.3 mg/dL — ABNORMAL HIGH (ref 0.0–1.2)
CALCIUM: 9.3 mg/dL (ref 8.7–10.2)
CHLORIDE: 101 mmol/L (ref 96–106)
CHOL/HDL RATIO: 2.6 ratio (ref 0.0–4.4)
Cholesterol, Total: 185 mg/dL (ref 100–199)
Creatinine, Ser: 0.66 mg/dL (ref 0.57–1.00)
EOS (ABSOLUTE): 0 10*3/uL (ref 0.0–0.4)
EOS: 2 %
Estimated CHD Risk: <0.5 times avg. (ref 0.0–1.0)
Free Thyroxine Index: 1.7 (ref 1.2–4.9)
GFR calc non Af Amer: 112 mL/min/{1.73_m2} (ref >59)
GFR, EST AFRICAN AMERICAN: 129 mL/min/{1.73_m2} (ref >59)
GGT: 10 IU/L (ref 0–60)
GLOBULIN, TOTAL: 2.5 g/dL (ref 1.5–4.5)
GLUCOSE: 95 mg/dL (ref 65–99)
HDL: 70 mg/dL (ref >39)
HEMATOCRIT: 40.1 % (ref 34.0–46.6)
Hemoglobin: 13.5 g/dL (ref 11.1–15.9)
IMMATURE GRANS (ABS): 0 10*3/uL (ref 0.0–0.1)
Immature Granulocytes: 0 %
Iron: 122 ug/dL (ref 27–159)
LDH: 125 IU/L (ref 119–226)
LDL CALC: 96 mg/dL (ref 0–99)
LYMPHS: 43 %
Lymphocytes Absolute: 1.2 10*3/uL (ref 0.7–3.1)
MCH: 30.3 pg (ref 26.6–33.0)
MCHC: 33.7 g/dL (ref 31.5–35.7)
MCV: 90 fL (ref 79–97)
MONOCYTES: 11 %
Monocytes Absolute: 0.3 10*3/uL (ref 0.1–0.9)
NEUTROS ABS: 1.1 10*3/uL — AB (ref 1.4–7.0)
Neutrophils: 42 %
POTASSIUM: 4.5 mmol/L (ref 3.5–5.2)
Phosphorus: 2.6 mg/dL (ref 2.5–4.5)
Platelets: 332 10*3/uL (ref 150–379)
RBC: 4.45 x10E6/uL (ref 3.77–5.28)
RDW: 13.1 % (ref 12.3–15.4)
SODIUM: 141 mmol/L (ref 134–144)
T3 Uptake Ratio: 27 % (ref 24–39)
T4, Total: 6.2 ug/dL (ref 4.5–12.0)
TRIGLYCERIDES: 93 mg/dL (ref 0–149)
TSH: 2.77 u[IU]/mL (ref 0.450–4.500)
Total Protein: 7.2 g/dL (ref 6.0–8.5)
Uric Acid: 1.6 mg/dL — ABNORMAL LOW (ref 2.5–7.1)
VLDL Cholesterol Cal: 19 mg/dL (ref 5–40)
WBC: 2.7 10*3/uL — AB (ref 3.4–10.8)

## 2016-09-04 LAB — VITAMIN D 25 HYDROXY (VIT D DEFICIENCY, FRACTURES): Vit D, 25-Hydroxy: 25 ng/mL — ABNORMAL LOW (ref 30.0–100.0)

## 2016-09-08 ENCOUNTER — Telehealth: Payer: Self-pay | Admitting: Medical

## 2016-09-08 NOTE — Telephone Encounter (Signed)
Ebony CargoClayton her husband called to say that Marylene Landngela has been running fever off and on for the last week and wanted to know if someone could call her about her labs. She has appointment next week but wanted to know labs results before is possible.

## 2016-09-15 NOTE — Telephone Encounter (Signed)
Pt coming in for PCP appt on 11/16/16.  BA

## 2016-09-16 ENCOUNTER — Encounter: Payer: Self-pay | Admitting: Medical

## 2016-09-16 ENCOUNTER — Ambulatory Visit: Payer: Self-pay | Admitting: Medical

## 2016-09-16 VITALS — BP 122/76 | HR 103 | Temp 98.8°F | Resp 16 | Ht 62.0 in | Wt 125.0 lb

## 2016-09-16 DIAGNOSIS — F419 Anxiety disorder, unspecified: Secondary | ICD-10-CM | POA: Insufficient documentation

## 2016-09-16 DIAGNOSIS — Z862 Personal history of diseases of the blood and blood-forming organs and certain disorders involving the immune mechanism: Secondary | ICD-10-CM

## 2016-09-16 DIAGNOSIS — Z0000 Encounter for general adult medical examination without abnormal findings: Secondary | ICD-10-CM

## 2016-09-16 DIAGNOSIS — E559 Vitamin D deficiency, unspecified: Secondary | ICD-10-CM

## 2016-09-16 NOTE — Progress Notes (Signed)
   Subjective:    Patient ID: Alexandria Garrett, female    DOB: 1975/08/16, 41 y.o.   MRN: [id number]  HPI Here for primary care exam.   Review of Systems  Constitutional: Positive for fever.  HENT: Positive for sinus pressure. Negative for congestion, sneezing and sore throat.   Eyes: Negative.   Respiratory: Positive for chest tightness.   Cardiovascular: Negative.   Gastrointestinal: Negative.   Endocrine: Negative.   Genitourinary: Negative.   Musculoskeletal: Positive for myalgias.  Skin: Negative.   Neurological: Negative.   Hematological: Negative.   Psychiatric/Behavioral: Negative for self-injury, sleep disturbance and suicidal ideas. The patient is nervous/anxious.   2 weeks ago but not now.. She has chest tightness at times but none now. Vaginal itching after antibiotics resolved on its own. Myalgias when she was sick now resolved. Patient said she finished keflex and had fever x 2 days afterwards and has had no fever since.She finished her Keflex over a week ago.Yesterday and today with sinus pressure , no pain or discharge or fever.     Objective:   Physical Exam  Constitutional: She is oriented to person, place, and time. She appears well-developed and well-nourished.  HENT:  Head: Normocephalic and atraumatic.  Right Ear: External ear normal.  Left Ear: External ear normal.  Nose: Nose normal.  Mouth/Throat: Oropharynx is clear and moist.  Eyes: EOM are normal. Pupils are equal, round, and reactive to light.  Neck: Normal range of motion. Neck supple.  Cardiovascular: Normal rate, regular rhythm, normal heart sounds and intact distal pulses.   Pulmonary/Chest: Effort normal and breath sounds normal.  Abdominal: Soft. Bowel sounds are normal.  Musculoskeletal: Normal range of motion.  Neurological: She is alert and oriented to person, place, and time. She has normal reflexes.  Skin: Skin is warm and dry.  Psychiatric: She has a normal mood and affect. Her  behavior is normal.          Assessment & Plan:  Reviewed labs with patient. She has had low WBC count since the beginning of  2017. Over the past year she has remained with a low WBC  Except for one time on 11/24/2015 in which her WBC was 4.0. On 09/03/2016 they were 2.7. She saw  Dr. Louretta ShortenGovinda Brahmanday last year and the patient says they found nothing. She is concerned and would like to be seen by [facility]. She also has vitamin D deficiency so I recommended vitamin D3 at 4000IU/day. Currently seeing a Dr Marcelle SmilingJessica Hairston  [facility]iat[facility] in [place] for her anxiety. She is to return to the clinic as needed.

## 2016-09-23 ENCOUNTER — Telehealth: Payer: Self-pay | Admitting: Registered Nurse

## 2016-09-23 ENCOUNTER — Encounter: Payer: Self-pay | Admitting: Registered Nurse

## 2016-09-23 NOTE — Telephone Encounter (Signed)
Received [facility] from [facility] requesting refill of montelukast sodium 10mg  po at bedtime #30 RF0. Telephone message left for patient to contact clinic to verify she requested refill and inquire how medication working for her and if any side effects.  I am willing to give patient a 90 day supply at this time then [facility].

## 2016-10-01 IMAGING — MG MM DIAG BREAST TOMO UNI RIGHT
8 series · 8 of 16 positions shown · non-contrast
Comparison: Previous exams including recent screening mammogram
dated 07/31/2015.

CLINICAL DATA: Patient returns today to evaluate a possible mass
within the upper-outer quadrant of the right breast.

Additional asymmetry within the far superior right breast/axilla was
also questioned on the recent screening mammogram.
EXAM:
DIGITAL DIAGNOSTIC RIGHT MAMMOGRAM WITH 3D TOMOSYNTHESIS WITH CAD
ULTRASOUND RIGHT BREAST

[R CC synth-2D]
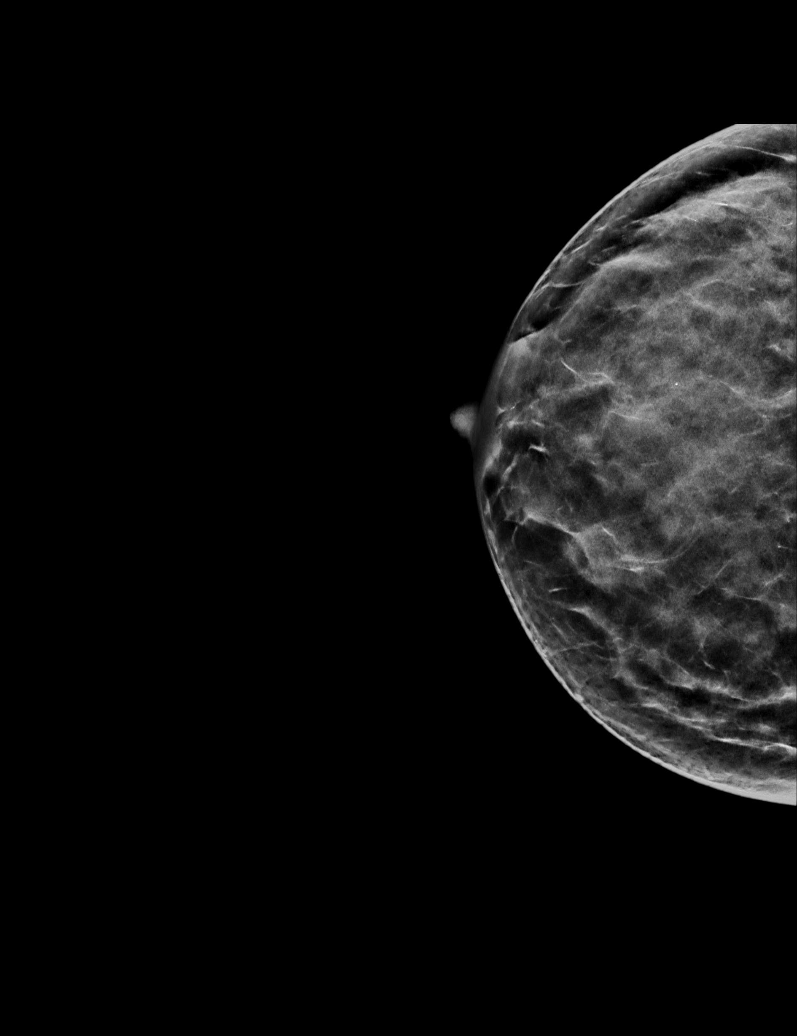

[R CC]
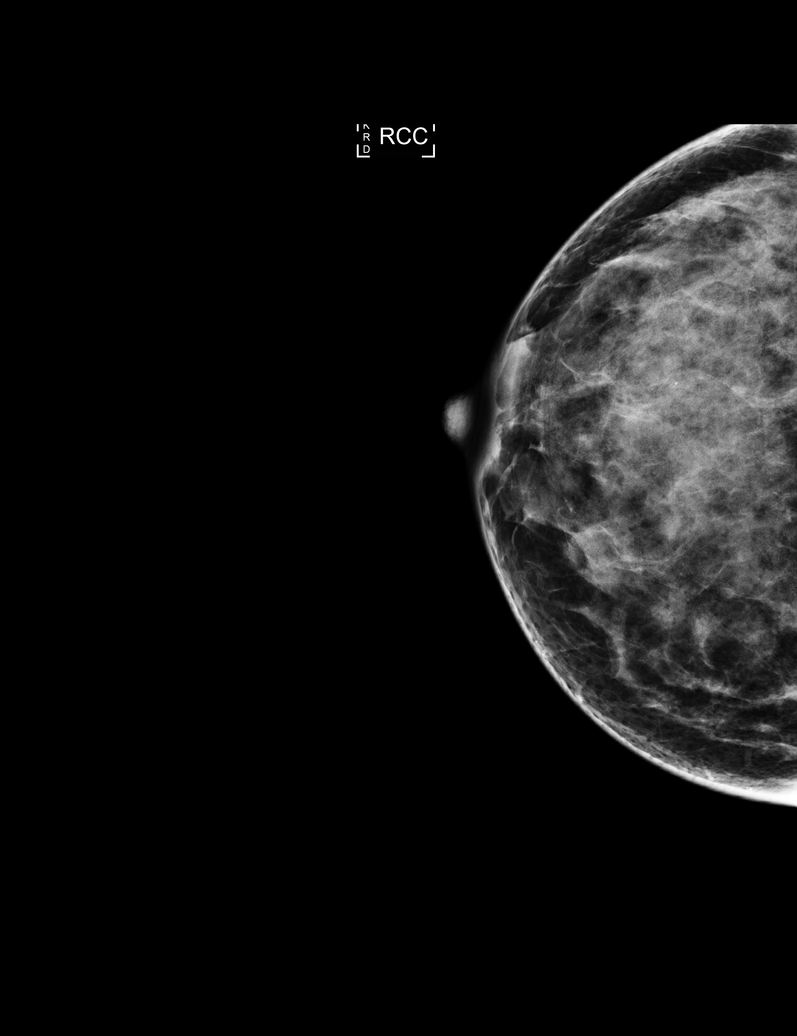

[R MLO]
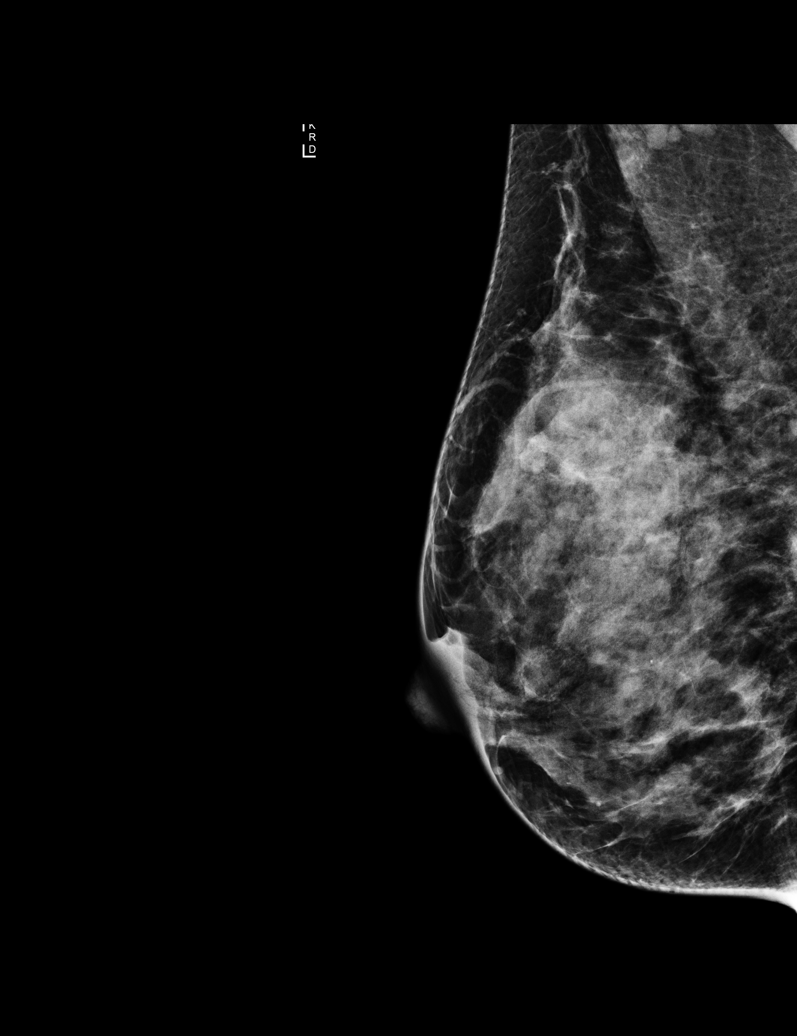

[R MLO synth-2D]
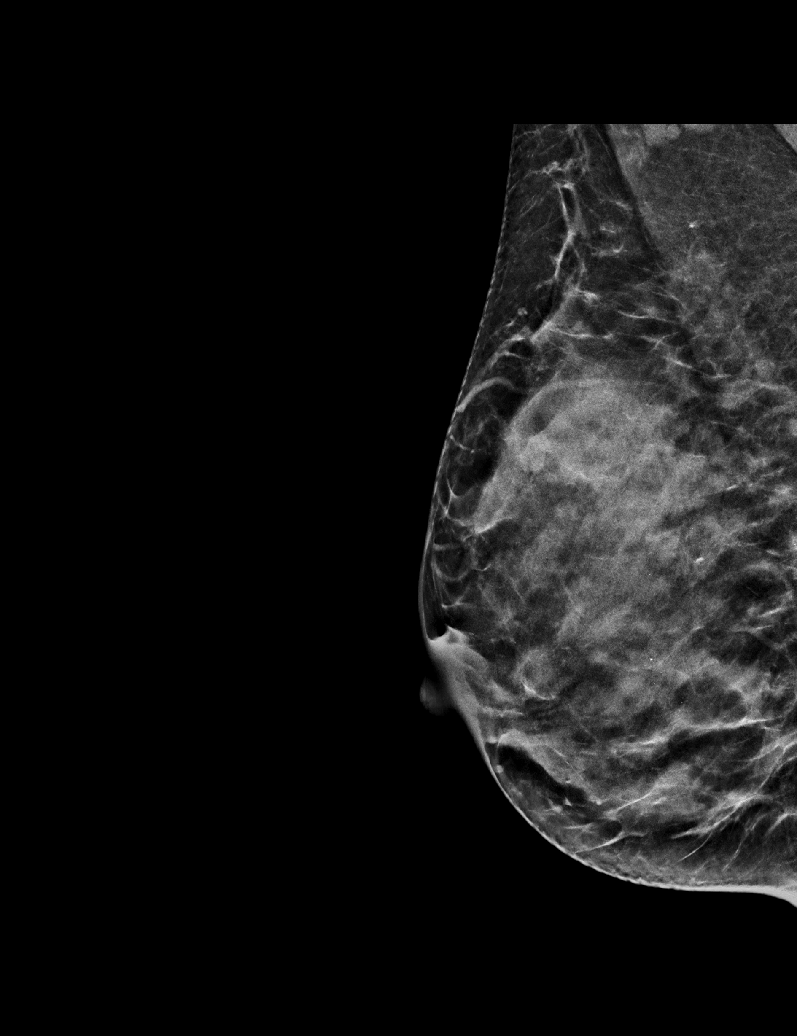

[R MLO tomo (1 of 2)]
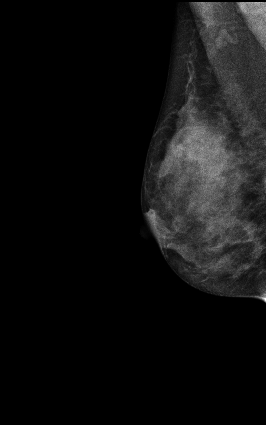

[R CC tomo (1 of 2)]
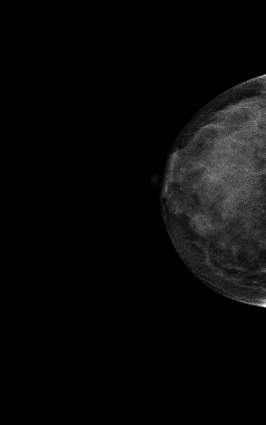

[R MLO tomo (2 of 2) · tomo slice 27/52.0]
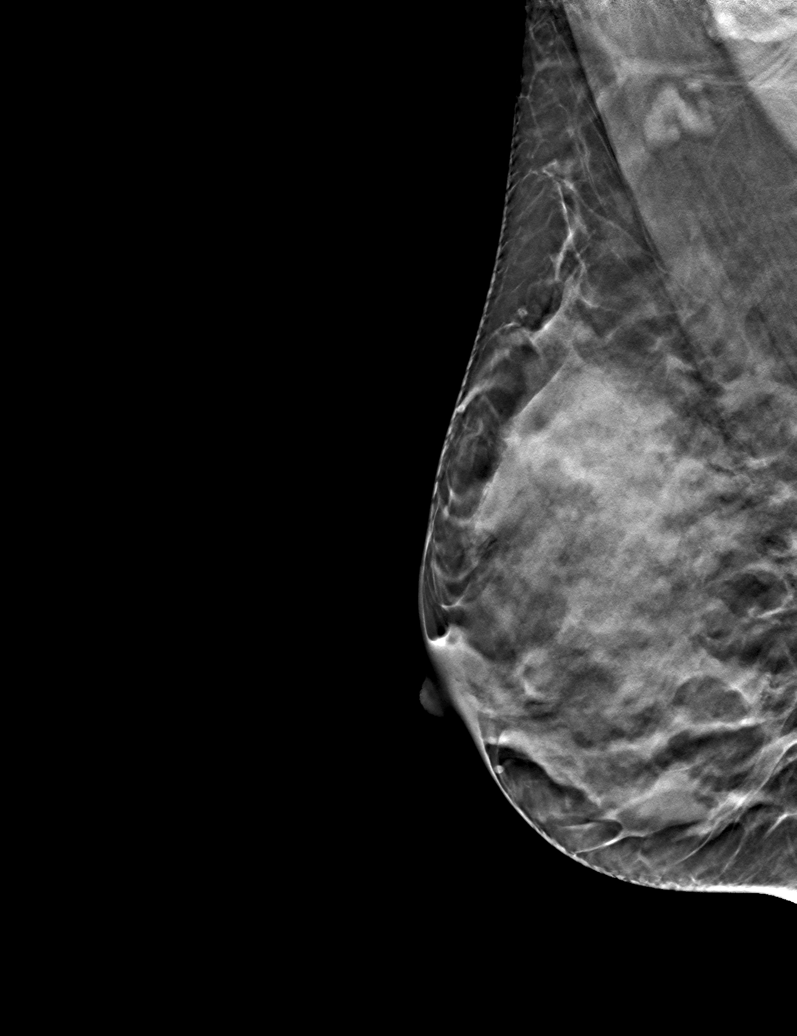

[R CC tomo (2 of 2) · tomo slice 27/52.0]
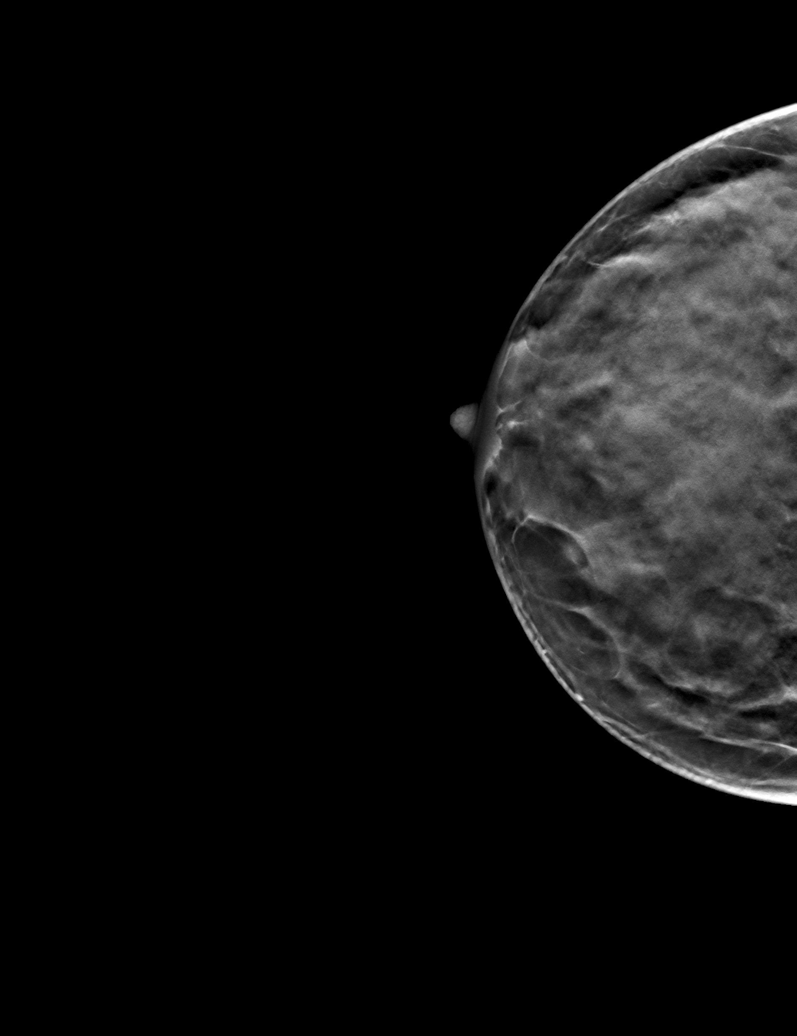

[8 of 16 positions shown; findings below may reference images not displayed]

ACR Breast Density Category c: The breast tissue is heterogeneously
dense, which may obscure small masses.
FINDINGS: On today's additional views of the right breast with 3D
tomosynthesis, there is a persistent oval circumscribed mass within
the upper-outer quadrant of the right breast, at middle to posterior
depth, measuring 8 mm greatest dimension.

The additional questioned asymmetry within the far superior right
breast/axilla is resolved on today's additional images.

Mammographic images were processed with CAD.

On physical exam, no palpable abnormality within the upper-outer
quadrant.

Targeted ultrasound is performed, evaluating the upper-outer
quadrant of the right breast, showing an oval circumscribed
hypoechoic mass at the 10 o'clock axis, 6 cm from the nipple,
measuring 9 x 3 x 5 mm, corresponding with the mammographic finding,
most suggestive of benign intramammary lymph node during real-time
ultrasound evaluation, less likely benign cluster of complicated
cysts with internal debris.

Multiple additional scattered cysts, some simple and some
complicated, are also appreciated within the outer right breast.

There are no suspicious solid or cystic masses identified by
ultrasound.
IMPRESSION: No evidence of malignancy within the right breast.

Benign intramammary lymph node versus cluster of cysts within the
upper-outer quadrant of the right breast, corresponding to the
screening mammogram finding.

Patient may return to routine annual bilateral screening mammogram
schedule.

RECOMMENDATION:
Screening mammogram in one year.(Code:09-R-OWV)

I have discussed the findings and recommendations with the patient.
Results were also provided in writing at the conclusion of the
visit. If applicable, a reminder letter will be sent to the patient
regarding the next appointment.

BI-RADS CATEGORY  2: Benign.

## 2016-10-20 ENCOUNTER — Telehealth: Payer: Self-pay | Admitting: *Deleted

## 2016-10-20 NOTE — Telephone Encounter (Signed)
10/20/2016  Called pt re: [facility] requesting refill on sinulair.  Per pt, she has never taken that med and has already asked [facility] of the system so we are no longer receiving requests. Pt confirms she is not taking Singulair and will notify [facility] again.  BA

## 2017-02-22 ENCOUNTER — Encounter: Payer: Self-pay | Admitting: Medical

## 2017-02-22 ENCOUNTER — Ambulatory Visit: Payer: Self-pay | Admitting: Medical

## 2017-02-22 VITALS — BP 128/84 | HR 96 | Temp 99.3°F | Wt 124.4 lb

## 2017-02-22 DIAGNOSIS — R300 Dysuria: Secondary | ICD-10-CM

## 2017-02-22 DIAGNOSIS — N3001 Acute cystitis with hematuria: Secondary | ICD-10-CM

## 2017-02-22 LAB — POCT URINALYSIS DIPSTICK
BILIRUBIN UA: NEGATIVE
Glucose, UA: NEGATIVE
Ketones, UA: NEGATIVE
NITRITE UA: NEGATIVE
PH UA: 6.5 (ref 5.0–8.0)
PROTEIN UA: NEGATIVE
Spec Grav, UA: <=1.005 — AB (ref 1.010–1.025)
Urobilinogen, UA: 0.2 E.U./dL

## 2017-02-22 MED ORDER — NITROFURANTOIN MONOHYD MACRO 100 MG PO CAPS: 100.0000 mg | ORAL_CAPSULE | Freq: Two times a day (BID) | ORAL | 0 refills | Status: AC

## 2017-02-22 NOTE — Patient Instructions (Signed)

## 2017-02-22 NOTE — Progress Notes (Signed)
   Subjective:    Patient ID: Alexandria Garrett, female    DOB: [birth year], 41 y.o.   MRN: [id number]  HPI 41 yo female with symptoms of burning on on urination, frequency and urgency and blood in urination starting Saturday. Feels like she is hot feeling, but not sure if she is running a fever. Similar to other UTIs however now she is seeing tiny blood clots.last period was 02/14/17   Review of Systems  Constitutional: Positive for fever. Negative for chills.  HENT: Negative for congestion, ear pain and sore throat.   Eyes: Negative for discharge and itching.  Respiratory: Negative for cough and shortness of breath.   Cardiovascular: Negative for chest pain.  Gastrointestinal: Negative for abdominal pain.  Genitourinary: Positive for dysuria and hematuria. Negative for frequency, pelvic pain and vaginal discharge.  Musculoskeletal: Positive for myalgias.  Neurological: Positive for headaches.  Psychiatric/Behavioral: Negative for confusion and hallucinations.       Objective:   Physical Exam  Constitutional: She is oriented to person, place, and time. She appears well-developed and well-nourished.  HENT:  Head: Normocephalic and atraumatic.  Eyes: Pupils are equal, round, and reactive to light. Conjunctivae and EOM are normal.  Abdominal: Soft. Bowel sounds are normal. She exhibits no distension. There is no tenderness. There is no rebound.  Neurological: She is alert and oriented to person, place, and time.  Skin: Skin is warm and dry.  Psychiatric: She has a normal mood and affect. Her behavior is normal. Judgment and thought content normal.  Nursing note and vitals reviewed.   No CVA tenderness UA showed  Trace amt of leukocytes, small amt of blood     Assessment & Plan:   Urinary tract infection ,  Talked to [facility] who gave patient a  4 days of Macrobid, patient feels she needs  7 days . Given Macrobid 100mg  one by mouth twice daily for an additional 3 days. Given 10 days  worth so she has one extra treatment dosage if needed for future UTIs. Suggested she try  One or two doses after sexual intercourse to try to prevent UTIs..  She also has started bike riding and says this also may have contributed to getting a UTI, recommended she obtain padded bike shorts to make bike riding more comfortable. Patient not trying to get pregnant, and uses condoms. On a side note patient is beginning classes at [facility].

## 2017-02-24 LAB — URINE CULTURE: ORGANISM ID, BACTERIA: NO GROWTH

## 2017-03-01 ENCOUNTER — Telehealth: Payer: Self-pay | Admitting: Medical

## 2017-03-01 NOTE — Telephone Encounter (Signed)
Called patient to review no growth with in urine Culture.   Pateint took 5 days of Macorbid and then stopped.   2 days later with low grade fever yesterday. So she restarted the Macrobid.She had no fever today.   She says she will follow up with her urologist whom she had seen for cystitis. Concerned she was passing clots at the time of her infection. I said she does need a recheck of her urine so that it is a good idea.

## 2017-03-01 NOTE — Telephone Encounter (Signed)
Called and lm for patient to call the clinic for urine C/S results.
